# Patient Record
Sex: Female | Born: 1940 | Race: White | Hispanic: No | Marital: Married | Smoking: Current every day smoker
Health system: Southern US, Community
[De-identification: ages and names within clinical notes are randomized; demographics above are authoritative.]

## PROBLEM LIST (undated history)

## (undated) DIAGNOSIS — F419 Anxiety disorder, unspecified: Secondary | ICD-10-CM

## (undated) DIAGNOSIS — E079 Disorder of thyroid, unspecified: Secondary | ICD-10-CM

## (undated) DIAGNOSIS — J449 Chronic obstructive pulmonary disease, unspecified: Secondary | ICD-10-CM

## (undated) HISTORY — PX: BACK SURGERY: SHX140

## (undated) HISTORY — PX: CARPAL TUNNEL RELEASE: SHX101

---

## 2015-11-14 ENCOUNTER — Emergency Department (INDEPENDENT_AMBULATORY_CARE_PROVIDER_SITE_OTHER): Payer: Worker's Compensation

## 2015-11-14 ENCOUNTER — Emergency Department (INDEPENDENT_AMBULATORY_CARE_PROVIDER_SITE_OTHER)
Admission: EM | Admit: 2015-11-14 | Discharge: 2015-11-14 | Disposition: A | Discharge status: Home / Self Care | Payer: Worker's Compensation | Source: Home / Self Care | Attending: Family Medicine | Admitting: Family Medicine

## 2015-11-14 ENCOUNTER — Encounter: Payer: Self-pay | Admitting: *Deleted

## 2015-11-14 DIAGNOSIS — S40012A Contusion of left shoulder, initial encounter: Secondary | ICD-10-CM

## 2015-11-14 DIAGNOSIS — M25552 Pain in left hip: Secondary | ICD-10-CM

## 2015-11-14 DIAGNOSIS — M25512 Pain in left shoulder: Secondary | ICD-10-CM | POA: Diagnosis not present

## 2015-11-14 DIAGNOSIS — S7002XA Contusion of left hip, initial encounter: Secondary | ICD-10-CM | POA: Diagnosis not present

## 2015-11-14 DIAGNOSIS — S5002XA Contusion of left elbow, initial encounter: Secondary | ICD-10-CM

## 2015-11-14 DIAGNOSIS — M85832 Other specified disorders of bone density and structure, left forearm: Secondary | ICD-10-CM | POA: Diagnosis not present

## 2015-11-14 HISTORY — DX: Disorder of thyroid, unspecified: E07.9

## 2015-11-14 HISTORY — DX: Anxiety disorder, unspecified: F41.9

## 2015-11-14 HISTORY — DX: Chronic obstructive pulmonary disease, unspecified: J44.9

## 2015-11-14 NOTE — Discharge Instructions (Signed)
Apply ice pack for 20 to 30 minutes, 3 to 4 times daily  Continue until pain decreases.  May take Tylenol as needed for pain.  Begin range of motion and stretching exercises as tolerated. °

## 2015-11-14 NOTE — ED Notes (Signed)
Worker's comp injury. Pt reports that she was going out to lunch when she slipped on the first step and fell down 2-3 steps at 1230 today. She c/o LT elbow and LT hip pain. She took tylenol at 1315.

## 2015-11-14 NOTE — ED Provider Notes (Signed)
CSN: [id number]     Arrival date & time 11/14/15  1450 History   First MD Initiated Contact with Patient 11/14/15 1510     Chief Complaint  Patient presents with  . Hip Pain  . Elbow Injury      HPI Comments: Patient presents for an occupational injury.  At 1230 today patient slipped at the top of wet stairs and fell backwards about two stairs.  She landed on her left buttock and left elbow.  She complains of pain in her left shoulder and upper humerus, her left elbow, and her left hip and buttock.  No loss of consciousness.  No radicular pain.  She has a past history of lumbar disc surgery L4-5.  Patient is a 74 y.o. female presenting with shoulder injury, arm injury, and hip pain. The history is provided by the patient.  Shoulder Injury This is a new problem. The current episode started less than 1 hour ago. The problem occurs constantly. The problem has not changed since onset.Exacerbated by: shoulder movement. Nothing relieves the symptoms. Treatments tried: ice. The treatment provided no relief.  Arm Injury Location:  Elbow Elbow location:  L elbow Pain details:    Quality:  Aching   Radiates to:  L upper arm   Severity:  Mild   Onset quality:  Sudden   Timing:  Constant   Progression:  Unchanged Chronicity:  New Dislocation: no   Prior injury to area:  No Relieved by:  Nothing Ineffective treatments:  NSAIDs Associated symptoms: no back pain, no decreased range of motion, no muscle weakness, no neck pain, no numbness, no stiffness, no swelling and no tingling   Hip Pain This is a new problem. The current episode started less than 1 hour ago. The problem has not changed since onset.The symptoms are aggravated by walking. Nothing relieves the symptoms. Treatments tried: ibuprofen. The treatment provided mild relief.    Past Medical History  Diagnosis Date  . COPD (chronic obstructive pulmonary disease) (HCC)   . Anxiety   . Thyroid disease    Past Surgical History   Procedure Laterality Date  . Back surgery    . Carpal tunnel release     Family History  Problem Relation Age of Onset  . Heart disease Mother   . Heart disease Father    Social History  Substance Use Topics  . Smoking status: Current Every Day Smoker -- 1.00 packs/day    Types: Cigarettes  . Smokeless tobacco: None  . Alcohol Use: No   OB History    No data available     Review of Systems  Musculoskeletal: Negative for back pain, stiffness and neck pain.  All other systems reviewed and are negative.   Allergies  Wellbutrin  Home Medications   Prior to Admission medications   Medication Sig Start Date End Date Taking? Authorizing Provider  albuterol (PROVENTIL) (2.5 MG/3ML) 0.083% nebulizer solution Take 2.5 mg by nebulization every 6 (six) hours as needed for wheezing or shortness of breath.   Yes Historical Provider, MD  Ascorbic Acid (VITAMIN C) 100 MG tablet Take 100 mg by mouth daily.   Yes Historical Provider, MD  cholecalciferol (VITAMIN D) 1000 UNITS tablet Take 1,000 Units by mouth daily.   Yes Historical Provider, MD  citalopram (CELEXA) 40 MG tablet Take 40 mg by mouth daily.   Yes Historical Provider, MD  levothyroxine (SYNTHROID, LEVOTHROID) 75 MCG tablet Take 75 mcg by mouth daily before breakfast.   Yes Historical Provider,  MD   Meds Ordered and Administered this Visit  Medications - No data to display  BP 162/80 mmHg  Pulse 57  Temp(Src) 98 F (36.7 C) (Oral)  Resp 16  Ht  (1.575 m)  Wt 130 lb (58.968 kg)  BMI 23.77 kg/m2  SpO2 97% No data found.   Physical Exam  Constitutional: She is oriented to person, place, and time. She appears well-developed and well-nourished. No distress.  HENT:  Head: Normocephalic.  Mouth/Throat: Oropharynx is clear and moist.  Neck is nontender and has full range of motion   Eyes: Conjunctivae are normal. Pupils are equal, round, and reactive to light.  Neck: Normal range of motion.  Cardiovascular:  Normal heart sounds.   Pulmonary/Chest: Breath sounds normal.  Abdominal: There is no tenderness.  Musculoskeletal:       Left shoulder: She exhibits tenderness and bony tenderness. She exhibits normal range of motion, no swelling, no crepitus, no deformity, no laceration, normal pulse and normal strength.       Left elbow: She exhibits normal range of motion, no swelling, no deformity and no laceration. No radial head, no medial epicondyle and no lateral epicondyle tenderness noted.       Left hip: She exhibits tenderness and bony tenderness. She exhibits normal range of motion, normal strength, no swelling, no crepitus, no deformity and no laceration.  Left shoulder has relatively good range of motion.  She has tenderness to palpation over the insertion of the long head of the biceps tendon.  Apley's test negative.  Empty can negative.  Good internal/external rotation range of motion and strength.  There is tenderness to palpation over the proximal humerus.  Left elbow has good range of motion.  There is distinct tenderness to palpation over the olecranon.  No radial head tendeness.  Distal neurovascular function is intact.   Left hip has distinct tenderness to palpation over the greater trochanter extending into the left buttock.  Hip has good range of motion.  No lumbosacral tenderness to palpation   Neurological: She is alert and oriented to person, place, and time.  Skin: Skin is warm and dry.  Nursing note reviewed.   ED Course  Procedures  None   Imaging Review Dg Elbow Complete Left  11/14/2015  CLINICAL DATA:  Slipped and fell down stairs today, left elbow pain. EXAM: LEFT ELBOW - COMPLETE 3+ VIEW COMPARISON:  None. FINDINGS: At least mild osteopenia. The osteopenia limits characterization of osseous detail but there is no discrete fracture line or displaced fracture fragment seen. Osseous alignment is normal. There is no evidence of joint effusion and adjacent soft tissues are  unremarkable. IMPRESSION: 1. No acute findings.  No fracture or dislocation seen. 2. Osteopenia. Electronically Signed   By: Bary Richard M.D.   On: 11/14/2015 15:55   Dg Shoulder Left  11/14/2015  CLINICAL DATA:  74 year old female status post fall down towards 3 stairs with left shoulder, elbow and hip pain EXAM: LEFT SHOULDER - 2+ VIEW COMPARISON:  Concurrently obtained radiographs of the left although FINDINGS: There is no evidence of fracture or dislocation. There is no evidence of arthropathy or other focal bone abnormality. Soft tissues are unremarkable. IMPRESSION: Negative. Electronically Signed   By: Malachy Moan M.D.   On: 11/14/2015 15:54   Dg Hip Unilat With Pelvis 2-3 Views Left  11/14/2015  CLINICAL DATA:  74 year old female status post fall about 2 or 3 stairs with left hip pain EXAM: DG HIP (WITH OR WITHOUT  PELVIS) 2-3V LEFT COMPARISON:  None. FINDINGS: There is no evidence of hip fracture or dislocation. There is no evidence of arthropathy or other focal bone abnormality. Incompletely imaged surgical changes of lumbosacral fusion. IMPRESSION: Negative. Electronically Signed   By: Malachy Moan M.D.   On: 11/14/2015 15:55      MDM   1. Contusion of left shoulder, initial encounter   2. Contusion of elbow, left, initial encounter   3. Contusion of left hip, initial encounter    Apply ice pack for 20 to 30 minutes, 3 to 4 times daily  Continue until pain decreases.  May take Tylenol as needed for pain.  Begin range of motion and stretching exercises as tolerated. [facility]up with [facility] in one week.      Lattie Haw, MD 11/14/15 [phone]

## 2016-01-03 ENCOUNTER — Telehealth: Payer: Self-pay | Admitting: Allergy

## 2016-01-03 NOTE — Telephone Encounter (Signed)
CALL ME

## 2016-02-11 ENCOUNTER — Telehealth: Payer: Self-pay | Admitting: *Deleted

## 2016-02-11 MED ORDER — AMOXICILLIN-POT CLAVULANATE 875-125 MG PO TABS: ORAL_TABLET | ORAL | Status: DC

## 2016-02-11 NOTE — Telephone Encounter (Signed)
Patient with several weeks of productive cough and chest congestion, malaise. She was on combination ICS plus LABA in the past but stopped because she was concerned about glaucoma risk with steroids. No fever. Recommend starting a Z-Pak as well as Symbicort 160 g 2 puffs twice a day for prevention. Continue Spiriva 1 inhalation daily and as needed [employer]. Needs follow-up with [place] or primary care provider in one week

## 2016-02-11 NOTE — Telephone Encounter (Signed)
Cough, chest congestion x 4 days no fever reviewed medication list with patient. Dr.Bhatti please advise.

## 2016-02-11 NOTE — Telephone Encounter (Signed)
Patient was prescribed Augmentin instead of azithromycin due to possibility of interaction with patient's antidepressant.

## 2016-04-22 ENCOUNTER — Other Ambulatory Visit: Payer: Self-pay

## 2016-04-22 MED ORDER — ALBUTEROL SULFATE 108 (90 BASE) MCG/ACT IN AEPB: 2.0000 | INHALATION_SPRAY | RESPIRATORY_TRACT | Status: DC | PRN

## 2016-11-10 IMAGING — CR DG SHOULDER 2+V*L*
3 series · 3 of 3 positions shown · non-contrast
Comparison: Concurrently obtained radiographs of the left although

CLINICAL DATA: 74-year-old female status post fall down towards 3
stairs with left shoulder, elbow and hip pain

EXAM:
LEFT SHOULDER - 2+ VIEW

[shoulder grashey]
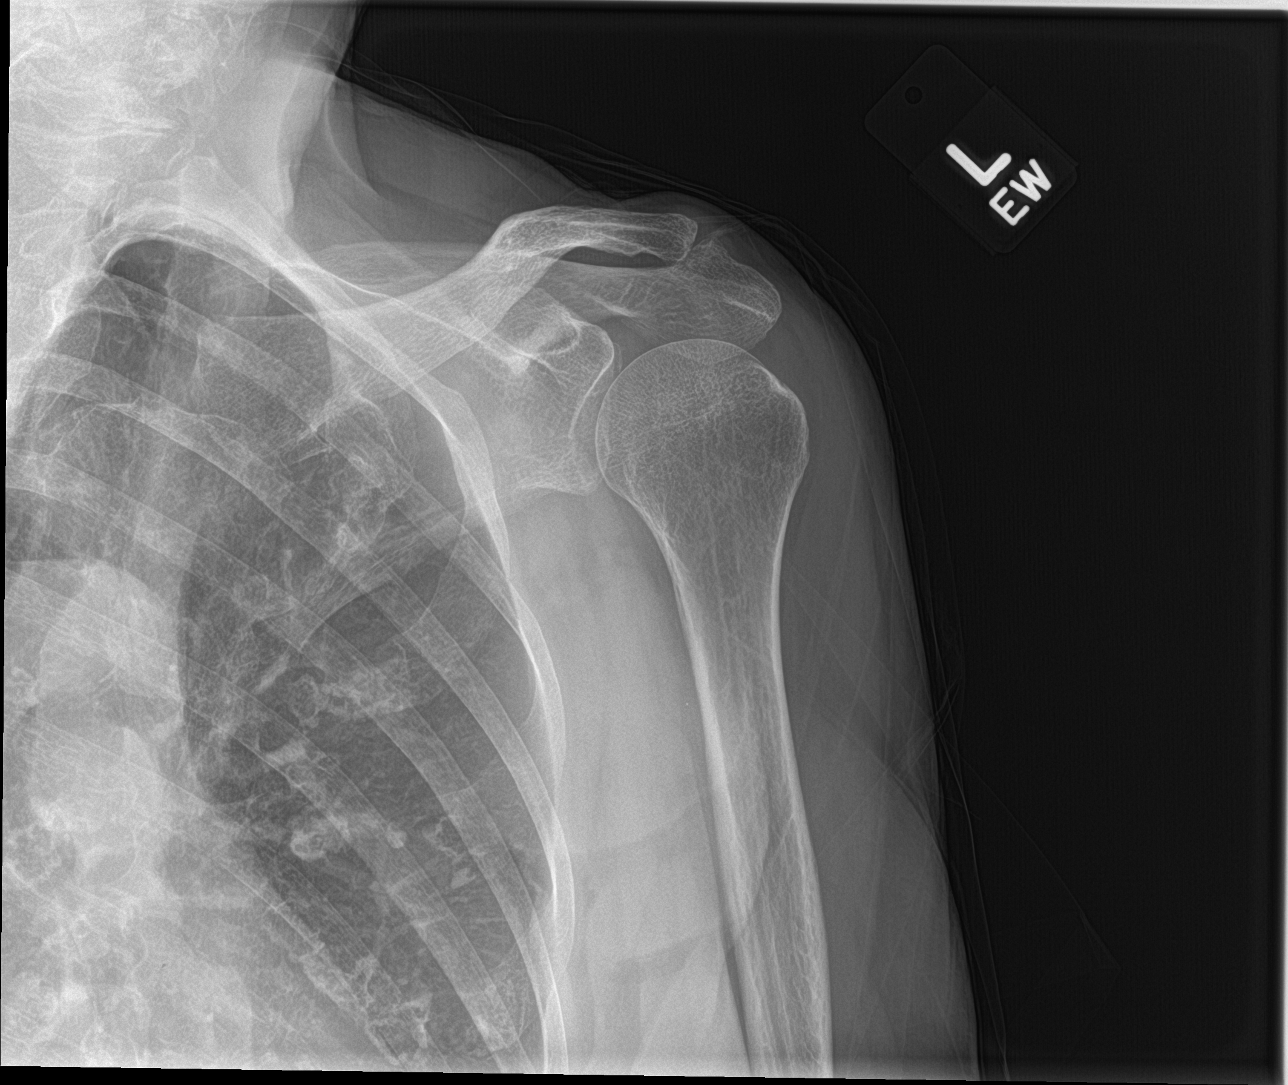

[shoulder y view]
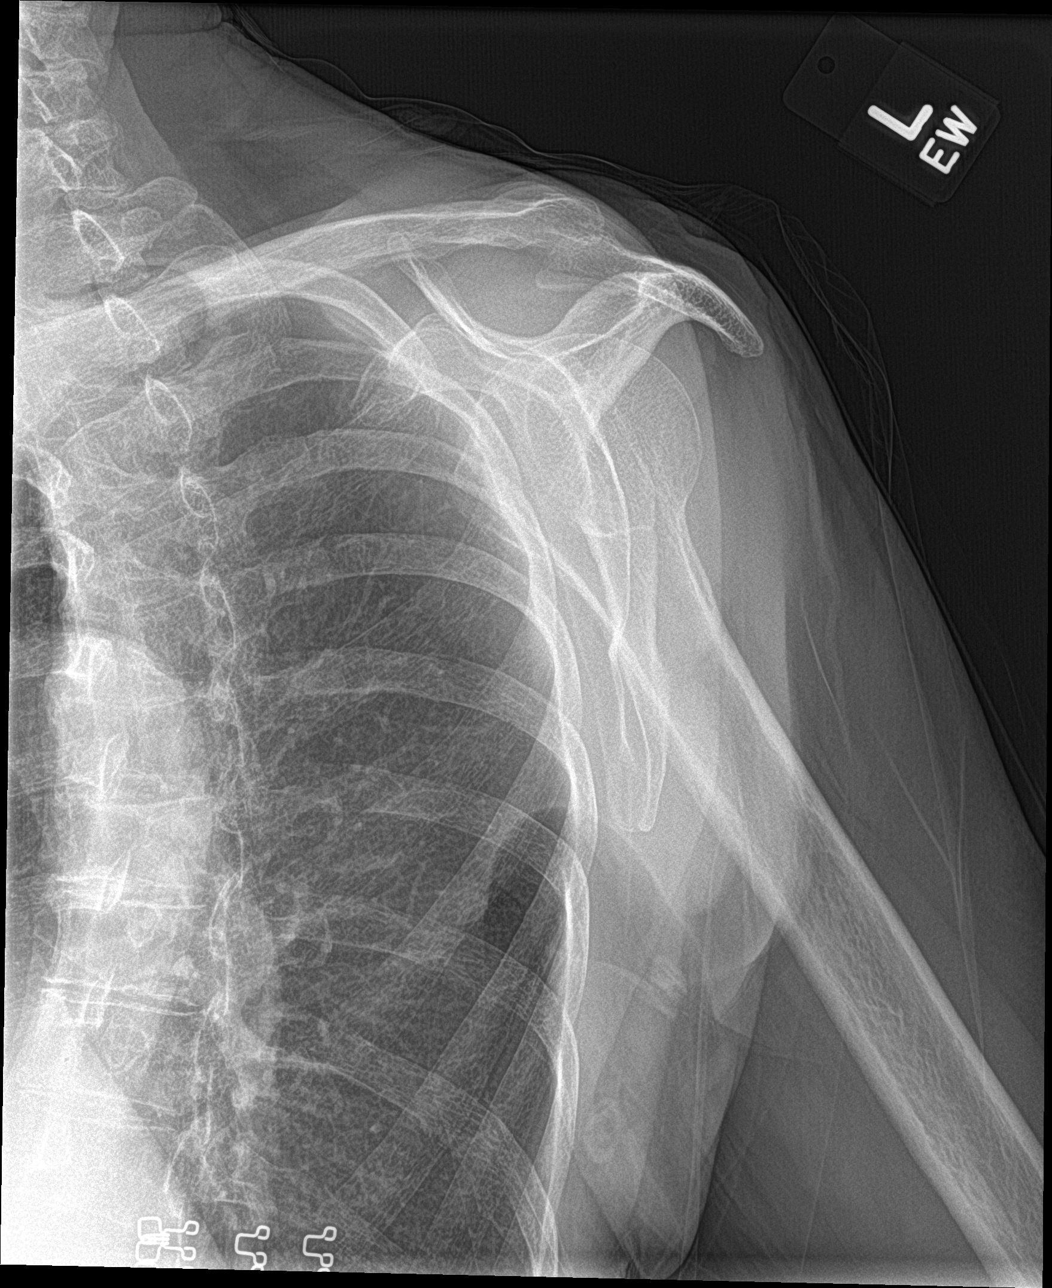

[shoulder axillary]
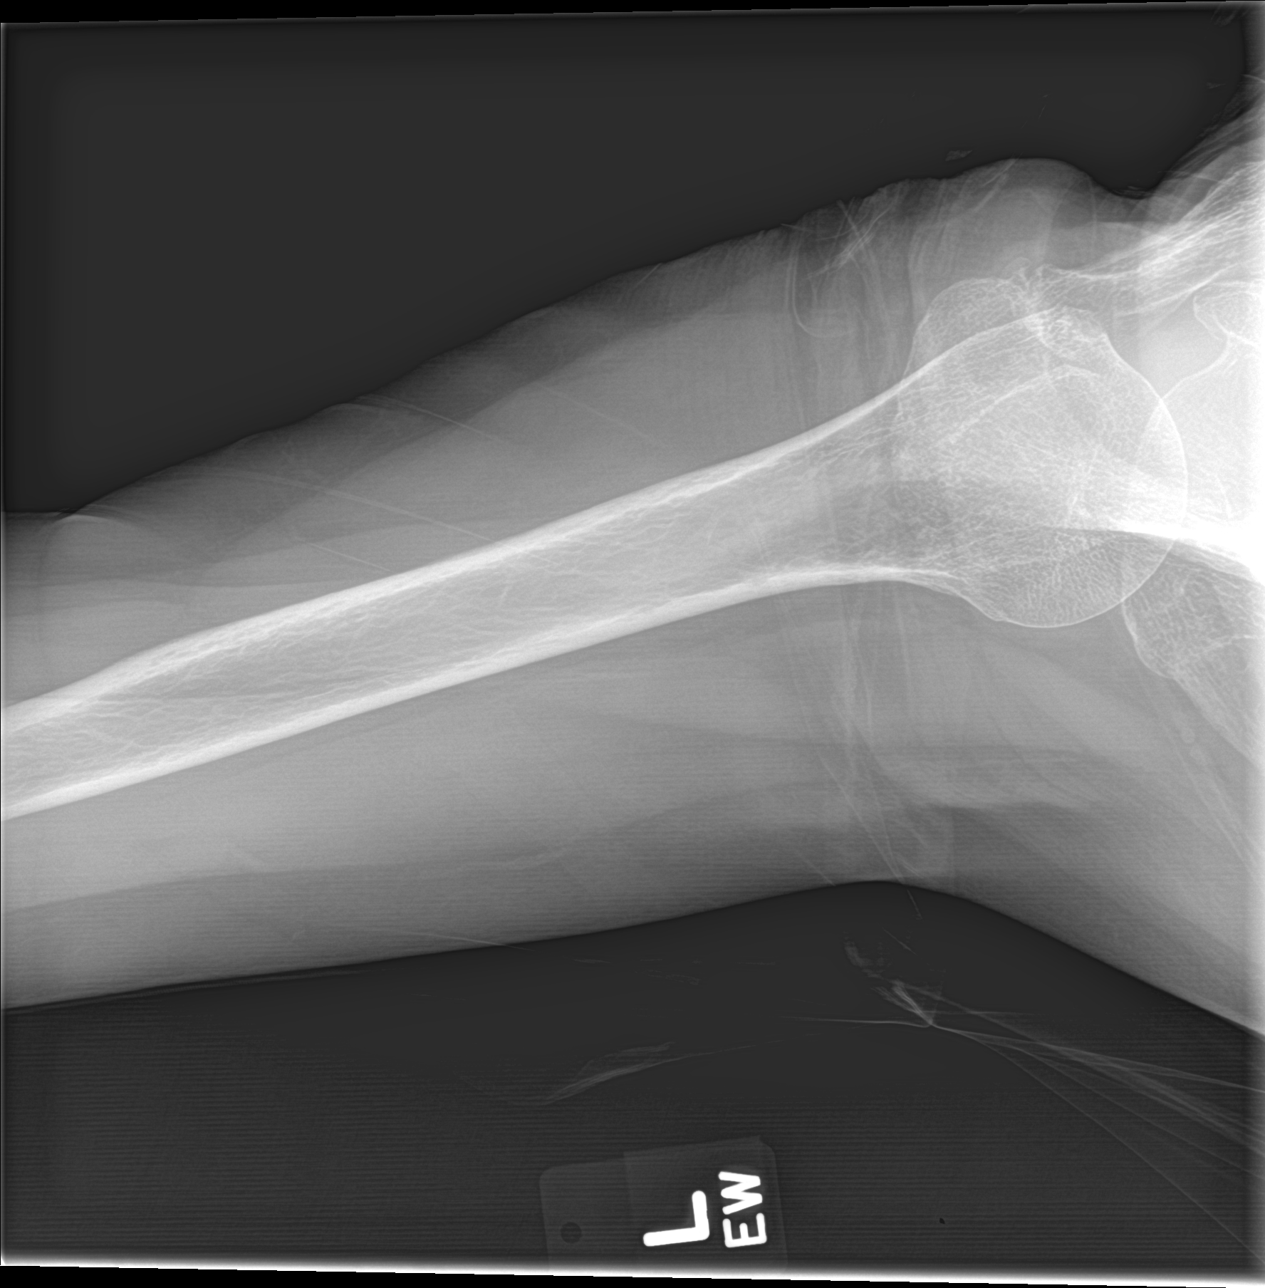

[3 of 3 positions shown; findings below may reference images not displayed]

FINDINGS: There is no evidence of fracture or dislocation. There is no
evidence of arthropathy or other focal bone abnormality. Soft
tissues are unremarkable.
IMPRESSION: Negative.

## 2016-11-10 IMAGING — CR DG HIP (WITH OR WITHOUT PELVIS) 2-3V*L*
4 series · 4 of 4 positions shown · non-contrast
Comparison: None.

CLINICAL DATA: 74-year-old female status post fall about 2 or 3
stairs with left hip pain

EXAM:
DG HIP (WITH OR WITHOUT PELVIS) 2-3V LEFT

[pelvis ap (1 of 2)]
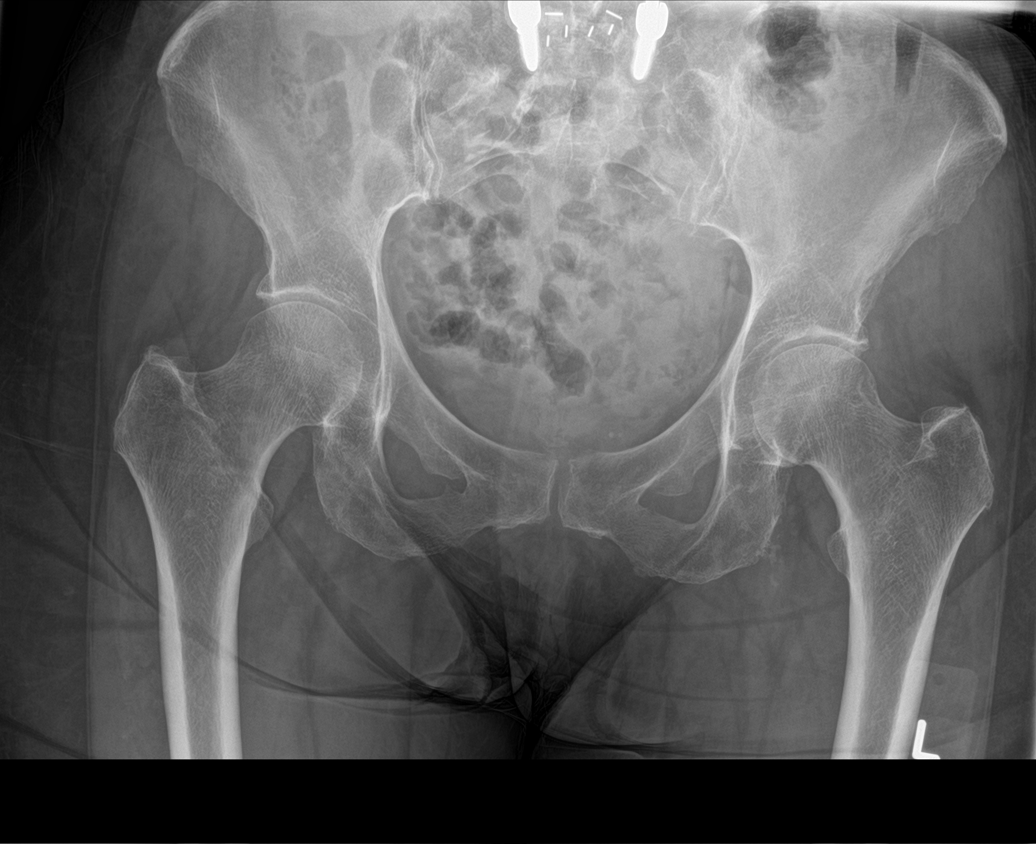

[hip ap]
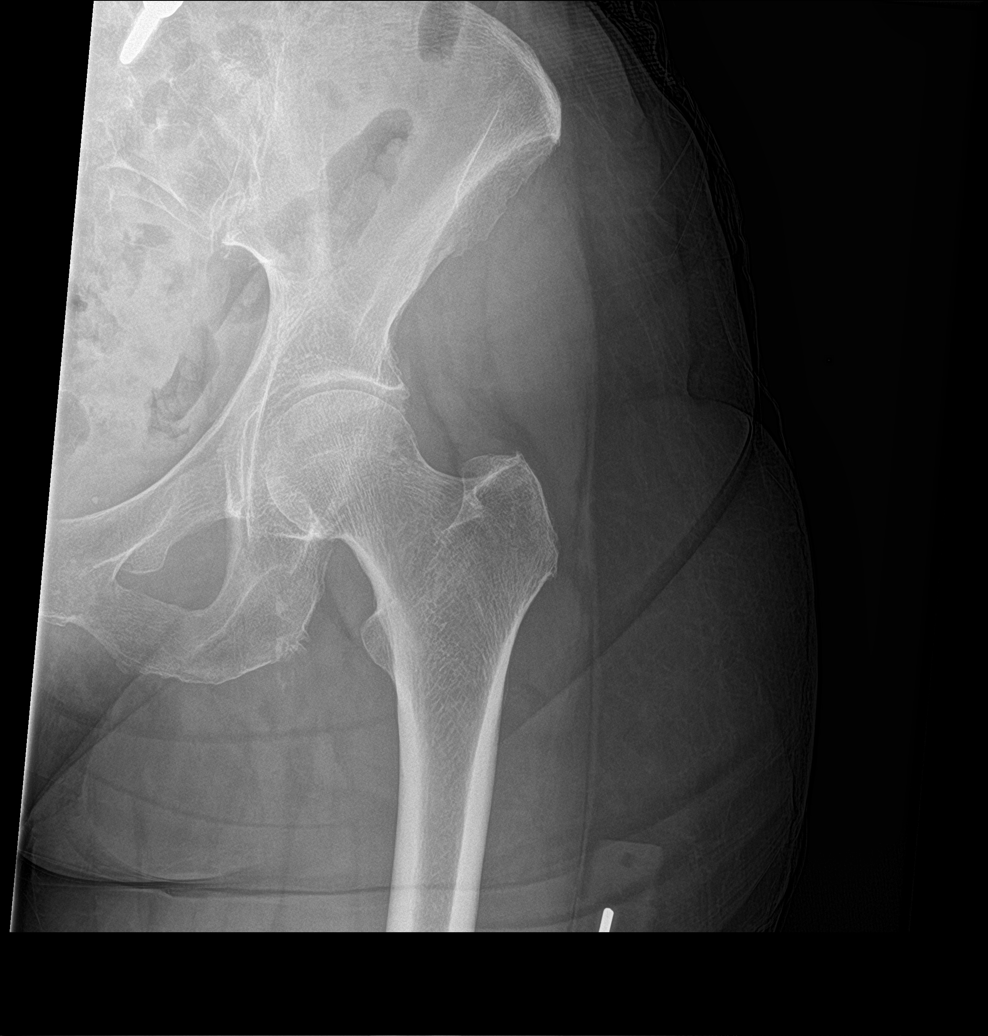

[hip lat]
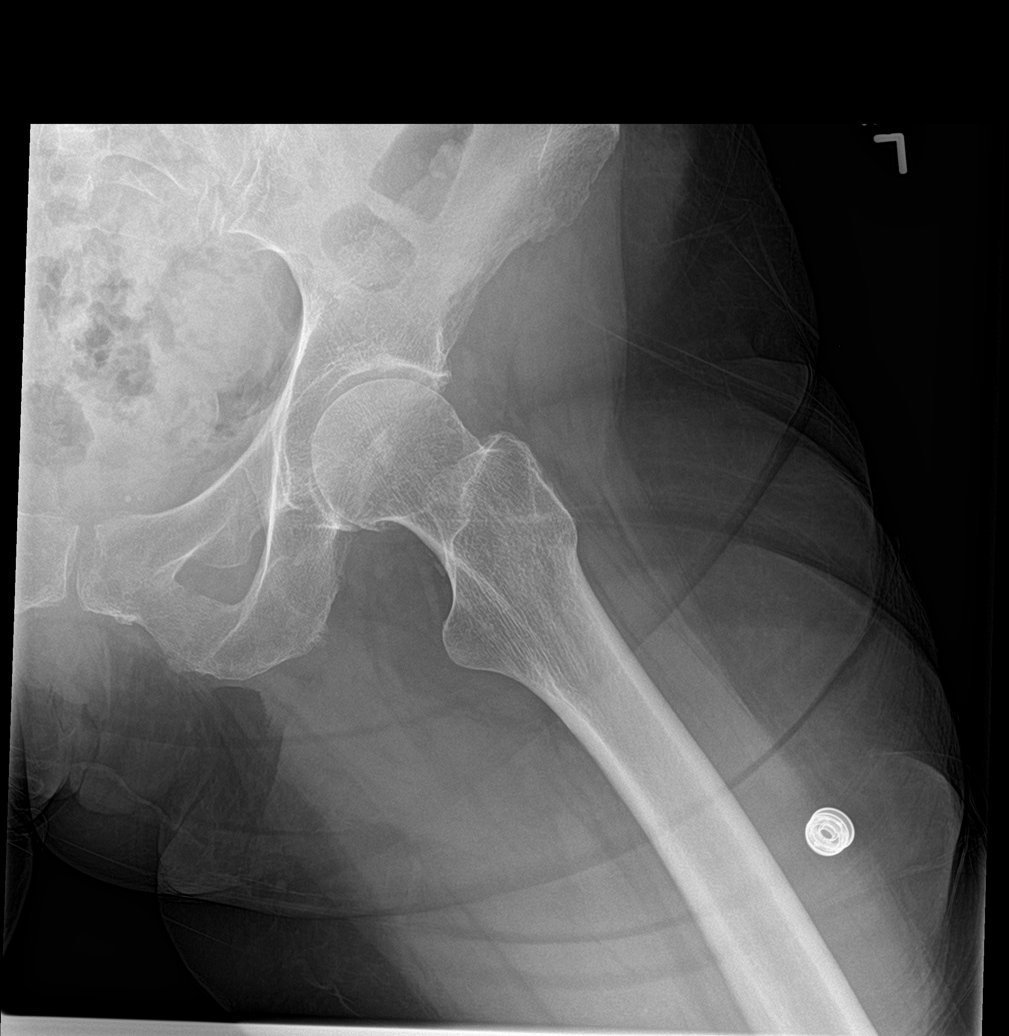

[pelvis ap (2 of 2)]
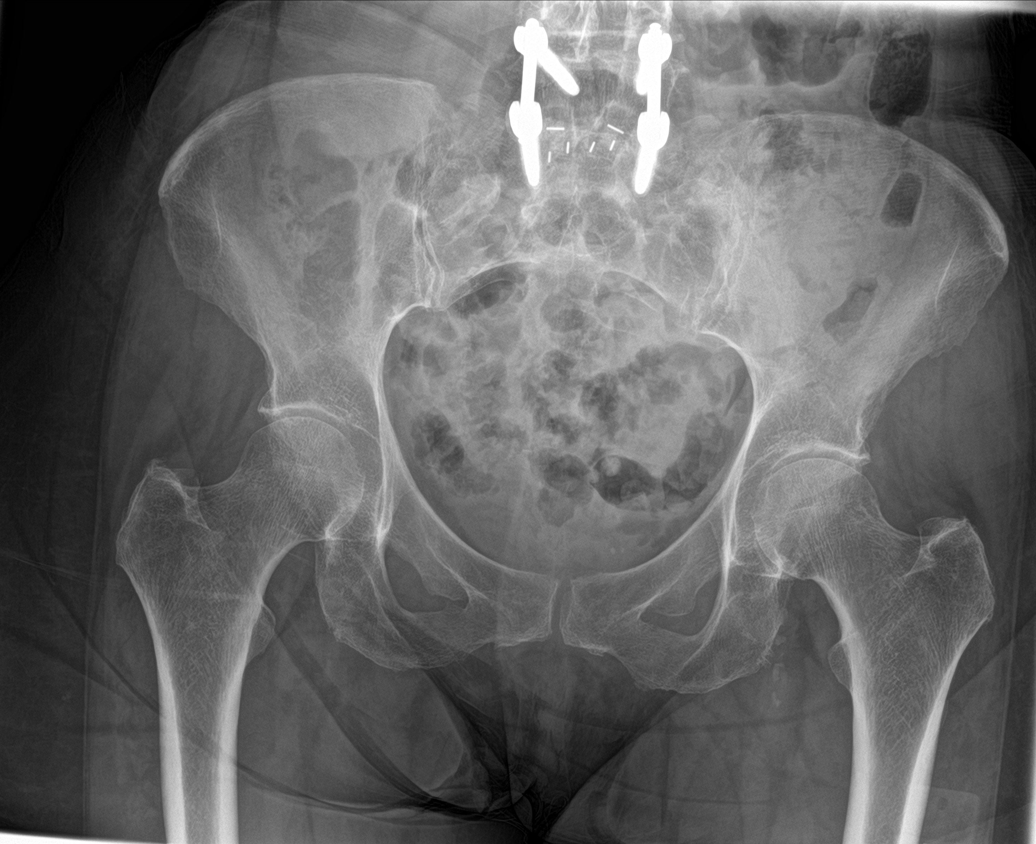

[4 of 4 positions shown; findings below may reference images not displayed]

FINDINGS: There is no evidence of hip fracture or dislocation. There is no
evidence of arthropathy or other focal bone abnormality.
Incompletely imaged surgical changes of lumbosacral fusion.
IMPRESSION: Negative.

## 2017-02-17 ENCOUNTER — Other Ambulatory Visit: Payer: Self-pay

## 2017-02-17 MED ORDER — OSELTAMIVIR PHOSPHATE 75 MG PO CAPS: 75.0000 mg | ORAL_CAPSULE | Freq: Two times a day (BID) | ORAL | 0 refills | Status: DC

## 2017-05-18 ENCOUNTER — Ambulatory Visit (INDEPENDENT_AMBULATORY_CARE_PROVIDER_SITE_OTHER): Payer: Medicare Other | Admitting: Pediatrics

## 2017-05-18 ENCOUNTER — Encounter: Payer: Self-pay | Admitting: Pediatrics

## 2017-05-18 VITALS — BP 118/60 | HR 88 | Temp 98.9°F | Resp 12 | Ht 62.0 in | Wt 132.3 lb

## 2017-05-18 DIAGNOSIS — J208 Acute bronchitis due to other specified organisms: Secondary | ICD-10-CM

## 2017-05-18 DIAGNOSIS — J441 Chronic obstructive pulmonary disease with (acute) exacerbation: Secondary | ICD-10-CM | POA: Diagnosis not present

## 2017-05-18 MED ORDER — AZITHROMYCIN 250 MG PO TABS: ORAL_TABLET | ORAL | 0 refills | Status: AC

## 2017-05-18 NOTE — Patient Instructions (Signed)
Continue on  current medications Add Zithromax 250-take 2 tablets today, then one tablet once a day for the next 4 days Add prednisone 20 mg twice a day for 3 days, 20 mg on the fourth day, 10 mg on the fifth day

## 2017-05-18 NOTE — Progress Notes (Signed)
  [address] [place] [address] Dept: [phone]  FOLLOW UP NOTE  Patient ID: Sherry LloydLinda Norris, female    DOB: 03/06/1941  Age: 76 y.o. MRN: [id number] Date of Office Visit: 05/18/2017  Assessment  Chief Complaint: Cough (started on saturday)  HPI Sherry Norris presents for evaluation of a cough for 2 days. She has not had a fever. She had been doing until the past 2 days. She is not having nasal congestion.  Current medications are Symbicort 80 -2 puffs every 12 hours. Spiriva Respimat  1.25- 2 puffs every 24 hours, albuterol 0.083% one unit dose every 4 hours if needed or instead Pro-air 2 puffs every 4 hours if needed. Her other medications are outlined in the chart   Drug Allergies:  Allergies  Allergen Reactions  . Wellbutrin [Bupropion]     Physical Exam: BP 118/60 (BP Location: Left Arm, Patient Position: Sitting, Cuff Size: Normal)   Pulse 88   Temp 98.9 F (37.2 C) (Tympanic)   Resp 12   Ht 5\' 2"  (1.575 m)   Wt 132 lb 4.4 oz (60 kg)   SpO2 92%   BMI 24.19 kg/m    Physical Exam  Constitutional: She is oriented to person, place, and time. She appears well-developed and well-nourished.  HENT:  Eyes normal. Ears normal. Nose normal. Pharynx normal.  Neck: Neck supple.  Cardiovascular:  S1 and S2 normal no murmurs  Pulmonary/Chest:  Clear to percussion and auscultation except for some rhonchi in the left lower lobe  Lymphadenopathy:    She has no cervical adenopathy.  Neurological: She is alert and oriented to person, place, and time.  Psychiatric: She has a normal mood and affect. Her behavior is normal. Judgment and thought content normal.  Vitals reviewed.   Diagnostics:  FVC 2.29 L FEV1 1.14 L. Predicted FVC 2.63 L predicted FEV1 1.97 L. After albuterol by nebulization FVC 2.09 L FEV1 1.06 L-This  shows a moderate reduction in the FEV1  with no significant improvement after albuterol  Assessment and Plan: 1. COPD exacerbation (HCC)   2. Acute  bronchitis due to other specified organisms     Meds ordered this encounter  Medications  . azithromycin (ZITHROMAX) 250 MG tablet    Sig: Take two tablets on day one, then one tablet once a day, days 2 thru 5, for infection.    Dispense:  6 each    Refill:  0    Patient Instructions  Continue on  current medications Add Zithromax 250-take 2 tablets today, then one tablet once a day for the next 4 days Add prednisone 20 mg twice a day for 3 days, 20 mg on the fourth day, 10 mg on the fifth day   Return if symptoms worsen or fail to improve.    Thank you for the opportunity to care for this patient.  Please do not hesitate to contact me with questions.  Tonette BihariJ. A. Inri Sobieski, M.D.  [facility] [address] [place], [place] [address] [phone]

## 2017-05-19 DIAGNOSIS — J441 Chronic obstructive pulmonary disease with (acute) exacerbation: Secondary | ICD-10-CM | POA: Insufficient documentation

## 2017-05-19 DIAGNOSIS — J208 Acute bronchitis due to other specified organisms: Secondary | ICD-10-CM | POA: Insufficient documentation
